# Patient Record
Sex: Male | Born: 1945
Health system: Southern US, Community
[De-identification: ages and names within clinical notes are randomized; demographics above are authoritative.]

## PROBLEM LIST (undated history)

## (undated) DIAGNOSIS — F32A Depression, unspecified: Secondary | ICD-10-CM

## (undated) DIAGNOSIS — F329 Major depressive disorder, single episode, unspecified: Secondary | ICD-10-CM

## (undated) DIAGNOSIS — C61 Malignant neoplasm of prostate: Secondary | ICD-10-CM

## (undated) DIAGNOSIS — C801 Malignant (primary) neoplasm, unspecified: Secondary | ICD-10-CM

## (undated) DIAGNOSIS — E119 Type 2 diabetes mellitus without complications: Secondary | ICD-10-CM

## (undated) DIAGNOSIS — H409 Unspecified glaucoma: Secondary | ICD-10-CM

---

## 2015-11-16 ENCOUNTER — Encounter (HOSPITAL_BASED_OUTPATIENT_CLINIC_OR_DEPARTMENT_OTHER): Payer: Self-pay | Admitting: *Deleted

## 2015-11-16 ENCOUNTER — Emergency Department (HOSPITAL_BASED_OUTPATIENT_CLINIC_OR_DEPARTMENT_OTHER)
Admission: EM | Admit: 2015-11-16 | Discharge: 2015-11-16 | Disposition: A | Discharge status: Home / Self Care | Payer: Medicare Other | Attending: Emergency Medicine | Admitting: Emergency Medicine

## 2015-11-16 ENCOUNTER — Emergency Department (HOSPITAL_BASED_OUTPATIENT_CLINIC_OR_DEPARTMENT_OTHER): Payer: Medicare Other

## 2015-11-16 DIAGNOSIS — Z7982 Long term (current) use of aspirin: Secondary | ICD-10-CM | POA: Diagnosis not present

## 2015-11-16 DIAGNOSIS — R358 Other polyuria: Secondary | ICD-10-CM | POA: Insufficient documentation

## 2015-11-16 DIAGNOSIS — Z79899 Other long term (current) drug therapy: Secondary | ICD-10-CM | POA: Diagnosis not present

## 2015-11-16 DIAGNOSIS — R631 Polydipsia: Secondary | ICD-10-CM | POA: Diagnosis not present

## 2015-11-16 DIAGNOSIS — R51 Headache: Secondary | ICD-10-CM | POA: Diagnosis present

## 2015-11-16 DIAGNOSIS — R739 Hyperglycemia, unspecified: Secondary | ICD-10-CM | POA: Diagnosis not present

## 2015-11-16 DIAGNOSIS — H538 Other visual disturbances: Secondary | ICD-10-CM | POA: Diagnosis not present

## 2015-11-16 HISTORY — DX: Depression, unspecified: F32.A

## 2015-11-16 HISTORY — DX: Major depressive disorder, single episode, unspecified: F32.9

## 2015-11-16 HISTORY — DX: Unspecified glaucoma: H40.9

## 2015-11-16 LAB — COMPREHENSIVE METABOLIC PANEL
ALBUMIN: 3.9 g/dL (ref 3.5–5.0)
ALK PHOS: 104 U/L (ref 38–126)
ALT: 33 U/L (ref 17–63)
AST: 30 U/L (ref 15–41)
Anion gap: 10 (ref 5–15)
BUN: 11 mg/dL (ref 6–20)
CALCIUM: 9.1 mg/dL (ref 8.9–10.3)
CHLORIDE: 98 mmol/L — AB (ref 101–111)
CO2: 24 mmol/L (ref 22–32)
Creatinine, Ser: 0.9 mg/dL (ref 0.61–1.24)
GFR calc non Af Amer: >60 mL/min (ref >60)
GLUCOSE: 435 mg/dL — AB (ref 65–99)
Potassium: 4.4 mmol/L (ref 3.5–5.1)
SODIUM: 132 mmol/L — AB (ref 135–145)
Total Bilirubin: 1.1 mg/dL (ref 0.3–1.2)
Total Protein: 8 g/dL (ref 6.5–8.1)

## 2015-11-16 LAB — CBG MONITORING, ED
GLUCOSE-CAPILLARY: 338 mg/dL — AB (ref 65–99)
Glucose-Capillary: 376 mg/dL — ABNORMAL HIGH (ref 65–99)

## 2015-11-16 MED ORDER — METFORMIN HCL 500 MG PO TABS
500.0000 mg | ORAL_TABLET | Freq: Once | ORAL | Status: AC
  Administered 2015-11-16: 500 mg via ORAL
  Filled 2015-11-16: qty 1

## 2015-11-16 MED ORDER — METFORMIN HCL 500 MG PO TABS: 500.0000 mg | ORAL_TABLET | Freq: Two times a day (BID) | ORAL | Status: AC

## 2015-11-16 MED ORDER — ACETAMINOPHEN 325 MG PO TABS
650.0000 mg | ORAL_TABLET | Freq: Once | ORAL | Status: AC
  Administered 2015-11-16: 650 mg via ORAL
  Filled 2015-11-16: qty 2

## 2015-11-16 MED ORDER — SODIUM CHLORIDE 0.9 % IV BOLUS (SEPSIS)
2000.0000 mL | Freq: Once | INTRAVENOUS | Status: AC
  Administered 2015-11-16: 2000 mL via INTRAVENOUS

## 2015-11-16 NOTE — ED Notes (Signed)
Nathan Christian at [facility] called and given report about the patient and the patients needs. Patient is very receptive to learning about his new onset Diabetes.

## 2015-11-16 NOTE — Discharge Instructions (Signed)
Hyperglycemia Keep your scheduled appointment at the [facility] clinic in 2 days. Today's blood sugar was elevated at 435. Hyperglycemia occurs when the glucose (sugar) in your blood is too high. Hyperglycemia can happen for many reasons, but it most often happens to people who do not know they have diabetes or are not managing their diabetes properly.  CAUSES  Whether you have diabetes or not, there are other causes of hyperglycemia. Hyperglycemia can occur when you have diabetes, but it can also occur in other situations that you might not be as aware of, such as: Diabetes  If you have diabetes and are having problems controlling your blood glucose, hyperglycemia could occur because of some of the following reasons:  Not following your meal plan.  Not taking your diabetes medications or not taking it properly.  Exercising less or doing less activity than you normally do.  Being sick. Pre-diabetes  This cannot be ignored. Before people develop Type 2 diabetes, they almost always have "pre-diabetes." This is when your blood glucose levels are higher than normal, but not yet high enough to be diagnosed as diabetes. Research has shown that some long-term damage to the body, especially the heart and circulatory system, may already be occurring during pre-diabetes. If you take action to manage your blood glucose when you have pre-diabetes, you may delay or prevent Type 2 diabetes from developing. Stress  If you have diabetes, you may be "diet" controlled or on oral medications or insulin to control your diabetes. However, you may find that your blood glucose is higher than usual in the hospital whether you have diabetes or not. This is often referred to as "stress hyperglycemia." Stress can elevate your blood glucose. This happens because of hormones put out by the body during times of stress. If stress has been the cause of your high blood glucose, it can be followed regularly by your caregiver. That way  he/she can make sure your hyperglycemia does not continue to get worse or progress to diabetes. Steroids  Steroids are medications that act on the infection fighting system (immune system) to block inflammation or infection. One side effect can be a rise in blood glucose. Most people can produce enough extra insulin to allow for this rise, but for those who cannot, steroids make blood glucose levels go even higher. It is not unusual for steroid treatments to "uncover" diabetes that is developing. It is not always possible to determine if the hyperglycemia will go away after the steroids are stopped. A special blood test called an A1c is sometimes done to determine if your blood glucose was elevated before the steroids were started. SYMPTOMS  Thirsty.  Frequent urination.  Dry mouth.  Blurred vision.  Tired or fatigue.  Weakness.  Sleepy.  Tingling in feet or leg. DIAGNOSIS  Diagnosis is made by monitoring blood glucose in one or all of the following ways:  A1c test. This is a chemical found in your blood.  Fingerstick blood glucose monitoring.  Laboratory results. TREATMENT  First, knowing the cause of the hyperglycemia is important before the hyperglycemia can be treated. Treatment may include, but is not be limited to:  Education.  Change or adjustment in medications.  Change or adjustment in meal plan.  Treatment for an illness, infection, etc.  More frequent blood glucose monitoring.  Change in exercise plan.  Decreasing or stopping steroids.  Lifestyle changes. HOME CARE INSTRUCTIONS   Test your blood glucose as directed.  Exercise regularly. Your caregiver will give you  instructions about exercise. Pre-diabetes or diabetes which comes on with stress is helped by exercising.  Eat wholesome, balanced meals. Eat often and at regular, fixed times. Your caregiver or nutritionist will give you a meal plan to guide your sugar intake.  Being at an ideal weight is  important. If needed, losing as little as 10 to 15 pounds may help improve blood glucose levels. SEEK MEDICAL CARE IF:   You have questions about medicine, activity, or diet.  You continue to have symptoms (problems such as increased thirst, urination, or weight gain). SEEK IMMEDIATE MEDICAL CARE IF:   You are vomiting or have diarrhea.  Your breath smells fruity.  You are breathing faster or slower.  You are very sleepy or incoherent.  You have numbness, tingling, or pain in your feet or hands.  You have chest pain.  Your symptoms get worse even though you have been following your caregiver's orders.  If you have any other questions or concerns.   This information is not intended to replace advice given to you by your health care provider. Make sure you discuss any questions you have with your health care provider.   Document Released: 02/17/2001 Document Revised: 11/16/2011 Document Reviewed: 04/30/2015 Elsevier Interactive Patient Education [employer].

## 2015-11-16 NOTE — ED Notes (Signed)
Pt from [facility].  Reports that he has had an intermittent HA behind his eyes and in his forehead x 3 days.  Denies fever.

## 2015-11-16 NOTE — ED Provider Notes (Signed)
CSN: [id number]     Arrival date & time 11/16/15  [id number] History   First MD Initiated Contact with Patient 11/16/15 1008     Chief Complaint  Patient presents with  . Headache     (Consider location/radiation/quality/duration/timing/severity/associated sxs/prior Treatment) HPI Complains a frontal headache and seeing scotomata i.e. "dots "intermittently for the past 5 days. Had a gradual onset. No focal numbness or weakness. No nausea or vomiting. Pain is mild at present. Treated with ibuprofen without relief. No fever. No trauma. No other associated symptoms. He does not see scotomata presently. No other associated symptoms No past medical history on file. Hepatitis C, glaucoma No past surgical history on file. No family history on file. Social History  Substance Use Topics  . Smoking status: Not on file  . Smokeless tobacco: Not on file  . Alcohol Use: Not on file    former smoker. Former user of alcohol. Former user of cocaine. None in the 120 days Review of Systems  Constitutional: Negative.   Eyes: Positive for visual disturbance. Negative for pain and discharge.  Respiratory: Negative.   Cardiovascular: Negative.   Gastrointestinal: Negative.   Endocrine: Positive for polydipsia and polyuria.  Musculoskeletal: Negative.   Skin: Negative.   Neurological: Positive for headaches.  Psychiatric/Behavioral: Negative.   All other systems reviewed and are negative.     Allergies  Review of patient's allergies indicates not on file.  Home Medications   Prior to Admission medications   Medication Sig Start Date End Date Taking? Authorizing Provider  aspirin EC 81 MG tablet Take 81 mg by mouth daily.   Yes Historical Provider, MD  ketotifen (ZADITOR) 0.025 % ophthalmic solution 1 drop 2 (two) times daily.   Yes Historical Provider, MD  Ledipasvir-Sofosbuvir 90-400 MG TABS Take by mouth.   Yes Historical Provider, MD  omeprazole (PRILOSEC) 20 MG capsule Take 20 mg by mouth  daily.   Yes Historical Provider, MD  sertraline (ZOLOFT) 100 MG tablet Take 100 mg by mouth daily.   Yes Historical Provider, MD   BP 120/84 mmHg  Pulse 72  Temp(Src) 98.8 F (37.1 C) (Oral)  Resp 18  Ht 6\' 1"  (1.854 m)  Wt 168 lb 7 oz (76.403 kg)  BMI 22.23 kg/m2  SpO2 98% Physical Exam  Constitutional: He is oriented to person, place, and time. He appears well-developed and well-nourished. No distress.  HENT:  Head: Normocephalic and atraumatic.  Eyes: Conjunctivae are normal. Pupils are equal, round, and reactive to light.  Neck: Neck supple. No tracheal deviation present. No thyromegaly present.  Cardiovascular: Normal rate and regular rhythm.   No murmur heard. Pulmonary/Chest: Effort normal and breath sounds normal.  Abdominal: Soft. Bowel sounds are normal. He exhibits no distension. There is no tenderness.  Musculoskeletal: Normal range of motion. He exhibits no edema or tenderness.  Neurological: He is alert and oriented to person, place, and time. No cranial nerve deficit. Coordination normal.  Gait normal Romberg normal. Pronator Drift normal DTR symmetric bilaterally knee jerk and ankle jerk and biceps was normal bilaterally finger to nose normal  Skin: Skin is warm and dry. No rash noted.  Psychiatric: He has a normal mood and affect.  Nursing note and vitals reviewed.   ED Course  Procedures (including critical care time) Labs Review Labs Reviewed - No data to display  Imaging Review No results found. I have personally reviewed and evaluated these images and lab results as part of my medical decision-making.   EKG Interpretation  None     2:40 PM after treatment with intravenous fluids, oral metformin and Tylenol patient is asymptomatic, pain-free, vision is normal. Appears in no distress Results for orders placed or performed during the hospital encounter of 11/16/15  Comprehensive metabolic panel  Result Value Ref Range   Sodium 132 (L) 135 - 145 mmol/L    Potassium 4.4 3.5 - 5.1 mmol/L   Chloride 98 (L) 101 - 111 mmol/L   CO2 24 22 - 32 mmol/L   Glucose, Bld 435 (H) 65 - 99 mg/dL   BUN 11 6 - 20 mg/dL   Creatinine, Ser 0.90 0.61 - 1.24 mg/dL   Calcium 9.1 8.9 - 10.3 mg/dL   Total Protein 8.0 6.5 - 8.1 g/dL   Albumin 3.9 3.5 - 5.0 g/dL   AST 30 15 - 41 U/L   ALT 33 17 - 63 U/L   Alkaline Phosphatase 104 38 - 126 U/L   Total Bilirubin 1.1 0.3 - 1.2 mg/dL   GFR calc non Af Amer >60 >60 mL/min   GFR calc Af Amer >60 >60 mL/min   Anion gap 10 5 - 15  CBG monitoring, ED  Result Value Ref Range   Glucose-Capillary 376 (H) 65 - 99 mg/dL   Ct Head Wo Contrast  11/16/2015  CLINICAL DATA:  Frontal headache x5 days, visual disturbance EXAM: CT HEAD WITHOUT CONTRAST TECHNIQUE: Contiguous axial images were obtained from the base of the skull through the vertex without intravenous contrast. COMPARISON:  None. FINDINGS: Motion degraded images. No evidence of parenchymal hemorrhage or extra-axial fluid collection. No mass lesion, mass effect, or midline shift. No CT evidence of acute infarction. Mild subcortical white matter and periventricular small vessel ischemic changes. Intracranial atherosclerosis. Cerebral volume is within normal limits.  No ventriculomegaly. The visualized paranasal sinuses are essentially clear. The mastoid air cells are unopacified. No evidence of calvarial fracture. IMPRESSION: Motion degraded images. No evidence of acute intracranial abnormality. Mild small vessel ischemic changes. Electronically Signed   By: Julian Hy M.D.   On: 11/16/2015 11:15    MDM  Plan prescription metformin. He is to keep his scheduled appointment with his doctor at the [facility] clinic in 2 days to discuss treatment options for diabetes dx. #1 nonspecific headache #2 hyperglycemia Final diagnoses:  None        Orlie Dakin, MD 11/16/15 1444

## 2017-05-14 ENCOUNTER — Emergency Department (HOSPITAL_BASED_OUTPATIENT_CLINIC_OR_DEPARTMENT_OTHER): Payer: Medicare Other

## 2017-05-14 ENCOUNTER — Emergency Department (HOSPITAL_BASED_OUTPATIENT_CLINIC_OR_DEPARTMENT_OTHER)
Admission: EM | Admit: 2017-05-14 | Discharge: 2017-05-14 | Discharge status: Against Medical Advice | Payer: Medicare Other | Attending: Emergency Medicine | Admitting: Emergency Medicine

## 2017-05-14 ENCOUNTER — Encounter (HOSPITAL_BASED_OUTPATIENT_CLINIC_OR_DEPARTMENT_OTHER): Payer: Self-pay | Admitting: *Deleted

## 2017-05-14 DIAGNOSIS — L03113 Cellulitis of right upper limb: Secondary | ICD-10-CM | POA: Diagnosis not present

## 2017-05-14 DIAGNOSIS — E119 Type 2 diabetes mellitus without complications: Secondary | ICD-10-CM | POA: Diagnosis not present

## 2017-05-14 DIAGNOSIS — Z7982 Long term (current) use of aspirin: Secondary | ICD-10-CM | POA: Insufficient documentation

## 2017-05-14 DIAGNOSIS — E876 Hypokalemia: Secondary | ICD-10-CM

## 2017-05-14 DIAGNOSIS — Z8546 Personal history of malignant neoplasm of prostate: Secondary | ICD-10-CM | POA: Insufficient documentation

## 2017-05-14 DIAGNOSIS — E871 Hypo-osmolality and hyponatremia: Secondary | ICD-10-CM | POA: Diagnosis not present

## 2017-05-14 DIAGNOSIS — Z79899 Other long term (current) drug therapy: Secondary | ICD-10-CM | POA: Diagnosis not present

## 2017-05-14 DIAGNOSIS — Z7984 Long term (current) use of oral hypoglycemic drugs: Secondary | ICD-10-CM | POA: Diagnosis not present

## 2017-05-14 DIAGNOSIS — R2231 Localized swelling, mass and lump, right upper limb: Secondary | ICD-10-CM | POA: Diagnosis present

## 2017-05-14 HISTORY — DX: Malignant (primary) neoplasm, unspecified: C80.1

## 2017-05-14 HISTORY — DX: Type 2 diabetes mellitus without complications: E11.9

## 2017-05-14 HISTORY — DX: Malignant neoplasm of prostate: C61

## 2017-05-14 LAB — CBC WITH DIFFERENTIAL/PLATELET
BAND NEUTROPHILS: 1 %
BASOS PCT: 0 %
Basophils Absolute: 0 10*3/uL (ref 0.0–0.1)
EOS PCT: 0 %
Eosinophils Absolute: 0 10*3/uL (ref 0.0–0.7)
HEMATOCRIT: 36.4 % — AB (ref 39.0–52.0)
Hemoglobin: 12.3 g/dL — ABNORMAL LOW (ref 13.0–17.0)
Lymphocytes Relative: 9 %
Lymphs Abs: 1.8 10*3/uL (ref 0.7–4.0)
MCH: 26.7 pg (ref 26.0–34.0)
MCHC: 33.8 g/dL (ref 30.0–36.0)
MCV: 79.1 fL (ref 78.0–100.0)
Monocytes Absolute: 2.6 10*3/uL — ABNORMAL HIGH (ref 0.1–1.0)
Monocytes Relative: 13 %
NEUTROS ABS: 15.3 10*3/uL — AB (ref 1.7–7.7)
Neutrophils Relative %: 77 %
Platelets: 261 10*3/uL (ref 150–400)
RBC: 4.6 MIL/uL (ref 4.22–5.81)
RDW: 15.6 % — AB (ref 11.5–15.5)
WBC: 19.7 10*3/uL — ABNORMAL HIGH (ref 4.0–10.5)

## 2017-05-14 LAB — COMPREHENSIVE METABOLIC PANEL
ALBUMIN: 3.1 g/dL — AB (ref 3.5–5.0)
ALT: 27 U/L (ref 17–63)
AST: 48 U/L — AB (ref 15–41)
Alkaline Phosphatase: 65 U/L (ref 38–126)
Anion gap: 11 (ref 5–15)
BUN: 10 mg/dL (ref 6–20)
CHLORIDE: 90 mmol/L — AB (ref 101–111)
CO2: 26 mmol/L (ref 22–32)
CREATININE: 0.98 mg/dL (ref 0.61–1.24)
Calcium: 8.4 mg/dL — ABNORMAL LOW (ref 8.9–10.3)
GFR calc non Af Amer: >60 mL/min (ref >60)
GLUCOSE: 110 mg/dL — AB (ref 65–99)
Potassium: 3 mmol/L — ABNORMAL LOW (ref 3.5–5.1)
SODIUM: 127 mmol/L — AB (ref 135–145)
Total Bilirubin: 1.2 mg/dL (ref 0.3–1.2)
Total Protein: 7.2 g/dL (ref 6.5–8.1)

## 2017-05-14 LAB — I-STAT CG4 LACTIC ACID, ED: LACTIC ACID, VENOUS: 1.89 mmol/L (ref 0.5–1.9)

## 2017-05-14 LAB — RAPID URINE DRUG SCREEN, HOSP PERFORMED
AMPHETAMINES: NOT DETECTED
BARBITURATES: NOT DETECTED
BENZODIAZEPINES: NOT DETECTED
COCAINE: POSITIVE — AB
Opiates: POSITIVE — AB
Tetrahydrocannabinol: NOT DETECTED

## 2017-05-14 LAB — URINALYSIS, ROUTINE W REFLEX MICROSCOPIC
Bilirubin Urine: NEGATIVE
GLUCOSE, UA: NEGATIVE mg/dL
KETONES UR: 15 mg/dL — AB
Nitrite: NEGATIVE
PH: 6.5 (ref 5.0–8.0)
PROTEIN: NEGATIVE mg/dL
Specific Gravity, Urine: 1.01 (ref 1.005–1.030)

## 2017-05-14 LAB — ETHANOL

## 2017-05-14 LAB — URINALYSIS, MICROSCOPIC (REFLEX): RBC / HPF: NONE SEEN RBC/hpf (ref 0–5)

## 2017-05-14 LAB — CK: Total CK: 501 U/L — ABNORMAL HIGH (ref 49–397)

## 2017-05-14 MED ORDER — SODIUM CHLORIDE 0.9 % IV BOLUS (SEPSIS)
1000.0000 mL | Freq: Once | INTRAVENOUS | Status: AC
  Administered 2017-05-14: 1000 mL via INTRAVENOUS

## 2017-05-14 MED ORDER — SODIUM CHLORIDE 0.9 % IV BOLUS (SEPSIS)
500.0000 mL | Freq: Once | INTRAVENOUS | Status: AC
  Administered 2017-05-14: 500 mL via INTRAVENOUS

## 2017-05-14 MED ORDER — VANCOMYCIN HCL IN DEXTROSE 1-5 GM/200ML-% IV SOLN
1000.0000 mg | Freq: Once | INTRAVENOUS | Status: AC
  Administered 2017-05-14: 1000 mg via INTRAVENOUS
  Filled 2017-05-14: qty 200

## 2017-05-14 MED ORDER — PIPERACILLIN-TAZOBACTAM 3.375 G IVPB: 3.3750 g | Freq: Three times a day (TID) | INTRAVENOUS | Status: DC

## 2017-05-14 MED ORDER — PIPERACILLIN-TAZOBACTAM 3.375 G IVPB 30 MIN
3.3750 g | Freq: Once | INTRAVENOUS | Status: AC
  Administered 2017-05-14: 3.375 g via INTRAVENOUS
  Filled 2017-05-14 (×2): qty 50

## 2017-05-14 MED ORDER — MORPHINE SULFATE (PF) 4 MG/ML IV SOLN
4.0000 mg | Freq: Once | INTRAVENOUS | Status: AC
  Administered 2017-05-14: 4 mg via INTRAVENOUS
  Filled 2017-05-14: qty 1

## 2017-05-14 MED ORDER — POTASSIUM CHLORIDE CRYS ER 20 MEQ PO TBCR
40.0000 meq | EXTENDED_RELEASE_TABLET | Freq: Once | ORAL | Status: AC
  Administered 2017-05-14: 40 meq via ORAL
  Filled 2017-05-14: qty 2

## 2017-05-14 MED ORDER — VANCOMYCIN HCL IN DEXTROSE 1-5 GM/200ML-% IV SOLN: 1000.0000 mg | Freq: Two times a day (BID) | INTRAVENOUS | Status: DC

## 2017-05-14 MED ORDER — CLINDAMYCIN HCL 300 MG PO CAPS: 300.0000 mg | ORAL_CAPSULE | Freq: Four times a day (QID) | ORAL | 0 refills | Status: AC

## 2017-05-14 MED FILL — CLINDAMYCIN HCL 300 MG CAPS: 300 | 10 days supply | Qty: 40 | Fill #0

## 2017-05-14 NOTE — Discharge Instructions (Signed)
You signed out against medical advice.   Take clindamycin as prescribed for arm cellulitis. Your infection is bad enough that you should stay in the hospital for IV antibiotics.   Take tylenol, motrin for pain.   Avoid using IV drugs.   See [facility] for follow up as soon as possible or see your doctor.   Return to ER if you have worse arm redness and swelling, fevers, fingers turning blue or arm numbness.

## 2017-05-14 NOTE — ED Triage Notes (Signed)
Swelling, pain and redness of his right arm. States he has been injecting Heroin.

## 2017-05-14 NOTE — Progress Notes (Addendum)
Pharmacy Antibiotic Note  Nathan Christian is a 71 y.o. male admitted on 05/14/2017 with cellulitis.  Pharmacy has been consulted for vancomycin and Zosyn dosing. Per RN notes, patient admitted to injecting heroin.  Plan: Vancomycin 1000mg  IV x1 Zosyn 3.375g IV x1 Will follow labs and enter maintenance doses based on renal function  Height: 6\' 1"  (185.4 cm) Weight: 169 lb (76.7 kg) IBW/kg (Calculated) : 79.9  Temp (24hrs), Avg:100 F (37.8 C), Min:100 F (37.8 C), Max:100 F (37.8 C)  No results for input(s): WBC, CREATININE, LATICACIDVEN, VANCOTROUGH, VANCOPEAK, VANCORANDOM, GENTTROUGH, GENTPEAK, GENTRANDOM, TOBRATROUGH, TOBRAPEAK, TOBRARND, AMIKACINPEAK, AMIKACINTROU, AMIKACIN in the last 168 hours.  CrCl cannot be calculated (Patient's most recent lab result is older than the maximum 21 days allowed.).    No Known Allergies  Antimicrobials this admission: Vancomycin 9/7 >>  Zosyn 9/7 >>   Dose adjustments this admission: n/a  Microbiology results: 9/7 BCx:     Thank you for allowing pharmacy to be a part of this patient's care.  Nathan Christian D. Annalicia Renfrew, PharmD, BCPS Clinical Pharmacist Pager: [phone] Clinical Phone for 05/14/2017 until 3:30pm: [phone] If after 3:30pm, please call main pharmacy at [phone] 05/14/2017 1:38 PM

## 2017-05-14 NOTE — Progress Notes (Signed)
Pharmacy Antibiotic Note Harsha Yusko is a 71 y.o. male admitted on 05/14/2017 with cellulitis.  Pharmacy has been consulted for vancomycin and Zosyn dosing. Per RN notes, patient admitted to injecting heroin.  Plan: 1. Vancomycin 1000mg  IV every 12 hours 2. Obtain vancomycin trough at [facility]; goal trough 15-20 until bacteremia can be r/o 3. Zosyn 3.375g IV every 8 hours (infused over 4 hours)   Height: 6\' 1"  (185.4 cm) Weight: 169 lb (76.7 kg) IBW/kg (Calculated) : 79.9  Temp (24hrs), Avg:100 F (37.8 C), Min:100 F (37.8 C), Max:100 F (37.8 C)   Recent Labs Lab 05/14/17 1347 05/14/17 1357  WBC 19.7*  --   CREATININE 0.98  --   LATICACIDVEN  --  1.89    Estimated Creatinine Clearance: 77.2 mL/min (by C-G formula based on SCr of 0.98 mg/dL).    No Known Allergies  Antimicrobials this admission: Vancomycin 9/7 >>  Zosyn 9/7 >>   Microbiology results: 9/7 BCx:    Thank you for allowing pharmacy to be a part of this patient's care.  Vincenza Hews, PharmD, BCPS 05/14/2017, 3:20 PM

## 2017-05-14 NOTE — ED Notes (Signed)
Patient transported to X-ray 

## 2017-05-14 NOTE — ED Provider Notes (Signed)
[facility] Provider Note   CSN: [id number] Arrival date & time: 05/14/17  1254     History   Chief Complaint Chief Complaint  Patient presents with  . Cellulitis    HPI Nathan Christian is a 71 y.o. male history of diabetes, known heroin abuser, cocaine user here presenting with right arm swelling and redness. Patient states that he does inject heroin very frequently last injected the back of his right forearm about 3 days ago. He states that over the last several days that forearm has become progressively swollen and tender and painful. He has some chills but denies any fevers. He states that he just wanted some antibiotics and does not want to stay in the hospital right now.  The history is provided by the patient.    Past Medical History:  Diagnosis Date  . Cancer ([place])   . Depression   . Diabetes mellitus without complication ([place])   . Glaucoma   . Prostate cancer ([place])     There are no active problems to display for this patient.   History reviewed. No pertinent surgical history.     Home Medications    Prior to Admission medications   Medication Sig Start Date End Date Taking? Authorizing Provider  aspirin EC 81 MG tablet Take 81 mg by mouth daily.    [provider]  ketotifen (ZADITOR) 0.025 % ophthalmic solution 1 drop 2 (two) times daily.    [provider]  Ledipasvir-Sofosbuvir 90-400 MG TABS Take by mouth.    [provider]  metFORMIN (GLUCOPHAGE) 500 MG tablet Take 1 tablet (500 mg total) by mouth 2 (two) times daily with a meal. 11/16/15   Winfred Leeds, Sam, MD  omeprazole (PRILOSEC) 20 MG capsule Take 20 mg by mouth daily.    [provider]  sertraline (ZOLOFT) 100 MG tablet Take 100 mg by mouth daily.    [provider]    Family History No family history on file.  Social History Social History  Substance Use Topics  . Smoking status: Never Smoker  . Smokeless tobacco: Never Used  .  Alcohol use No     Allergies   Patient has no known allergies.   Review of Systems Review of Systems  Skin: Positive for color change.  All other systems reviewed and are negative.    Physical Exam Updated Vital Signs BP 138/68   Pulse 90   Temp 100 F (37.8 C) (Oral)   Resp 17   Ht 6\' 1"  (1.854 m)   Wt 76.7 kg (169 lb)   SpO2 100%   BMI 22.30 kg/m   Physical Exam  Constitutional:  Uncomfortable   HENT:  Head: Normocephalic.  Eyes: Pupils are equal, round, and reactive to light. Conjunctivae and EOM are normal.  Neck: Normal range of motion. Neck supple.  Cardiovascular: Normal rate, regular rhythm and normal heart sounds.   No obvious heart murmurs   Pulmonary/Chest: Effort normal and breath sounds normal. No respiratory distress. He has no wheezes.  Abdominal: Soft. Bowel sounds are normal. He exhibits no distension. There is no tenderness.  Musculoskeletal:  Multiple IV drug injection sites. R forearm with obvious cellulitis of the entire forearm, R radial pulse palpable. No obvious subcutaneous air. No obvious fluctuance. Multiple injection sites on the forearm as well.   Neurological: He is alert.  Skin: Skin is warm. There is erythema.  Psychiatric: He has a normal mood and affect.  Nursing note and vitals reviewed.  ED Treatments / Results  Labs (all labs ordered are listed, but only abnormal results are displayed) Labs Reviewed  COMPREHENSIVE METABOLIC PANEL - Abnormal; Notable for the following:       Result Value   Sodium 127 (*)    Potassium 3.0 (*)    Chloride 90 (*)    Glucose, Bld 110 (*)    Calcium 8.4 (*)    Albumin 3.1 (*)    AST 48 (*)    All other components within normal limits  CBC WITH DIFFERENTIAL/PLATELET - Abnormal; Notable for the following:    WBC 19.7 (*)    Hemoglobin 12.3 (*)    HCT 36.4 (*)    RDW 15.6 (*)    Neutro Abs 15.3 (*)    Monocytes Absolute 2.6 (*)    All other components within normal limits    URINALYSIS, ROUTINE W REFLEX MICROSCOPIC - Abnormal; Notable for the following:    Hgb urine dipstick TRACE (*)    Ketones, ur 15 (*)    Leukocytes, UA SMALL (*)    All other components within normal limits  RAPID URINE DRUG SCREEN, HOSP PERFORMED - Abnormal; Notable for the following:    Opiates POSITIVE (*)    Cocaine POSITIVE (*)    All other components within normal limits  CK - Abnormal; Notable for the following:    Total CK 501 (*)    All other components within normal limits  URINALYSIS, MICROSCOPIC (REFLEX) - Abnormal; Notable for the following:    Bacteria, UA RARE (*)    Squamous Epithelial / LPF 0-5 (*)    All other components within normal limits  CULTURE, BLOOD (ROUTINE X 2)  ETHANOL  I-STAT CG4 LACTIC ACID, ED    EKG  EKG Interpretation None       Radiology Dg Chest 2 View  Result Date: 05/14/2017 CLINICAL DATA:  Fever. EXAM: CHEST  2 VIEW COMPARISON:  Radiograph of February 05, 2013. FINDINGS: The heart size and mediastinal contours are within normal limits. Both lungs are clear. Atherosclerosis of thoracic aorta is noted. No pneumothorax or pleural effusion is noted. The visualized skeletal structures are unremarkable. IMPRESSION: No active cardiopulmonary disease.  Aortic atherosclerosis. Electronically Signed   By: Marijo Conception, M.D.   On: 05/14/2017 14:28   Dg Forearm Right  Result Date: 05/14/2017 CLINICAL DATA:  Right arm cellulitis EXAM: RIGHT FOREARM - 2 VIEW COMPARISON:  None. FINDINGS: No fracture or malalignment. No periostitis. Increased opacity proximal forearm presumably due to edema. No radiopaque foreign body. No soft tissue gas. IMPRESSION: No acute osseus abnormality. Prominent soft tissue opacity proximal forearm and elbow presumably related to the history of cellulitis. No soft tissue gas. Electronically Signed   By: Donavan Foil M.D.   On: 05/14/2017 14:29    Procedures Procedures (including critical care time)  Medications Ordered in  ED Medications  vancomycin (VANCOCIN) IVPB 1000 mg/200 mL premix (not administered)  potassium chloride SA (K-DUR,KLOR-CON) CR tablet 40 mEq (not administered)  piperacillin-tazobactam (ZOSYN) IVPB 3.375 g (not administered)  vancomycin (VANCOCIN) IVPB 1000 mg/200 mL premix (not administered)  sodium chloride 0.9 % bolus 1,000 mL (0 mLs Intravenous Stopped 05/14/17 1435)    And  sodium chloride 0.9 % bolus 1,000 mL (0 mLs Intravenous Stopped 05/14/17 1435)    And  sodium chloride 0.9 % bolus 500 mL (500 mLs Intravenous New Bag/Given 05/14/17 1357)  piperacillin-tazobactam (ZOSYN) IVPB 3.375 g (3.375 g Intravenous New Bag/Given 05/14/17 1405)  morphine 4  MG/ML injection 4 mg (4 mg Intravenous Given 05/14/17 1358)     Initial Impression / Assessment and Plan / ED Course  I have reviewed the triage vital signs and the nursing notes.  Pertinent labs & imaging results that were available during my care of the patient were reviewed by me and considered in my medical decision making (see chart for details).     Nathan Christian is a 71 y.o. male here with R forearm cellulitis. He is a known IV drug user. He has low grade temp, hypotensive 90s in triage. Code sepsis initiated. Concerned for bacteremia from cellulitis, possible nec fasc R forearm, vs simple cellulitis from IV drug use. Will give 30 cc /kg bolus, give vanc/zosyn. Will get xrays for R forearm. Patient states that he doesn't want to be admitted.   3:45 PM WBC 20. UDS + cocaine and opiates. Na 127, K 3.0. Given vanc/zosyn. BP up to 104/69. I think he is septic. I again offered admission but he refused. He is alert and not intoxicated and understands that he may lose his arm or life. He will sign AMA paperwork. Will give clindamycin, will not prescribe pain meds   Final Clinical Impressions(s) / ED Diagnoses   Final diagnoses:  None    New Prescriptions New Prescriptions   No medications on file     Drenda Freeze, MD 05/14/17  1557

## 2017-05-19 LAB — CULTURE, BLOOD (ROUTINE X 2)
CULTURE: NO GROWTH
SPECIAL REQUESTS: ADEQUATE

## 2018-01-15 IMAGING — DX DG FOREARM 2V*R*
2 series · 2 of 2 positions shown · non-contrast
Comparison: None.

CLINICAL DATA: Right arm cellulitis

EXAM:
RIGHT FOREARM - 2 VIEW

[forearm ap]
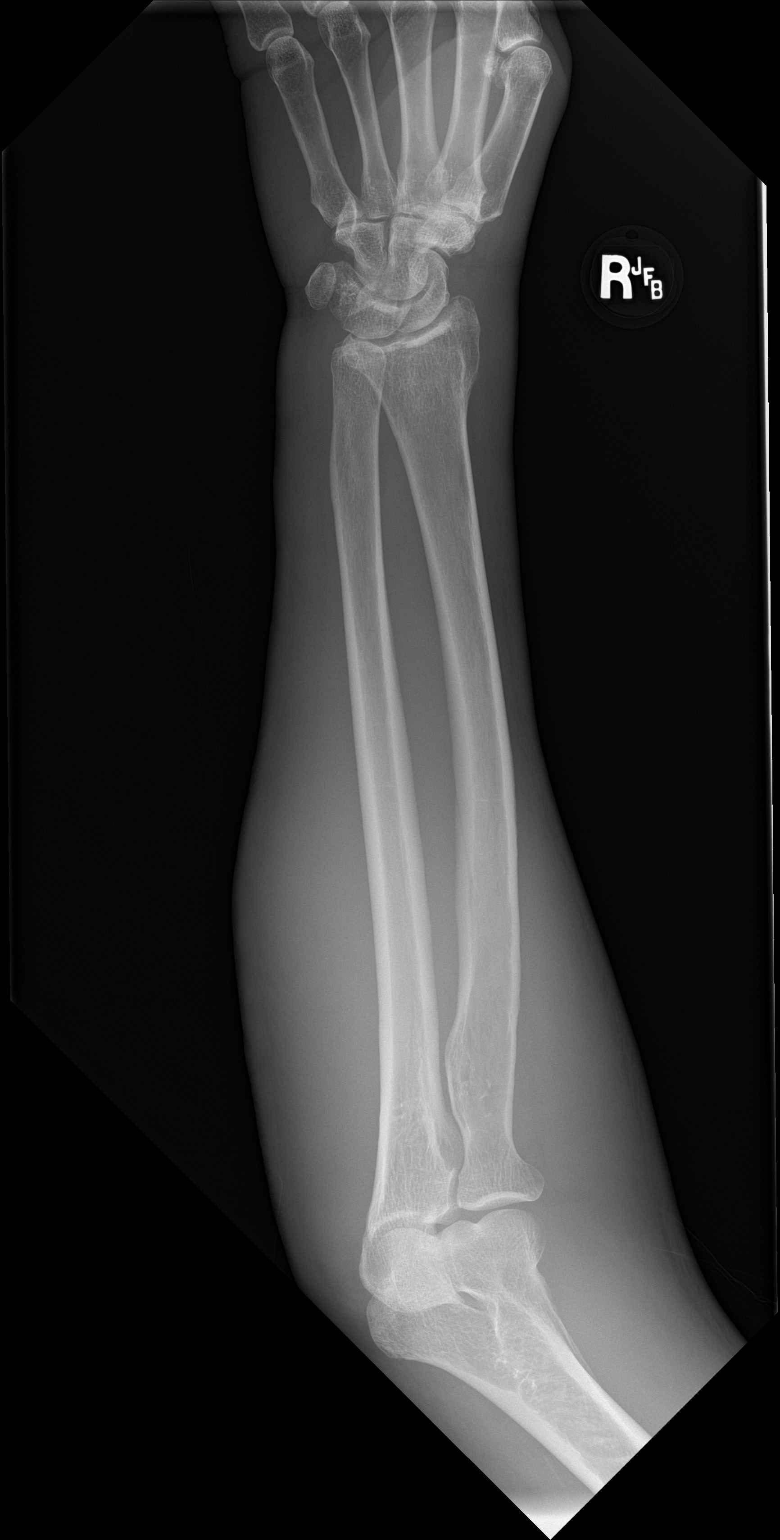

[forearm lat]
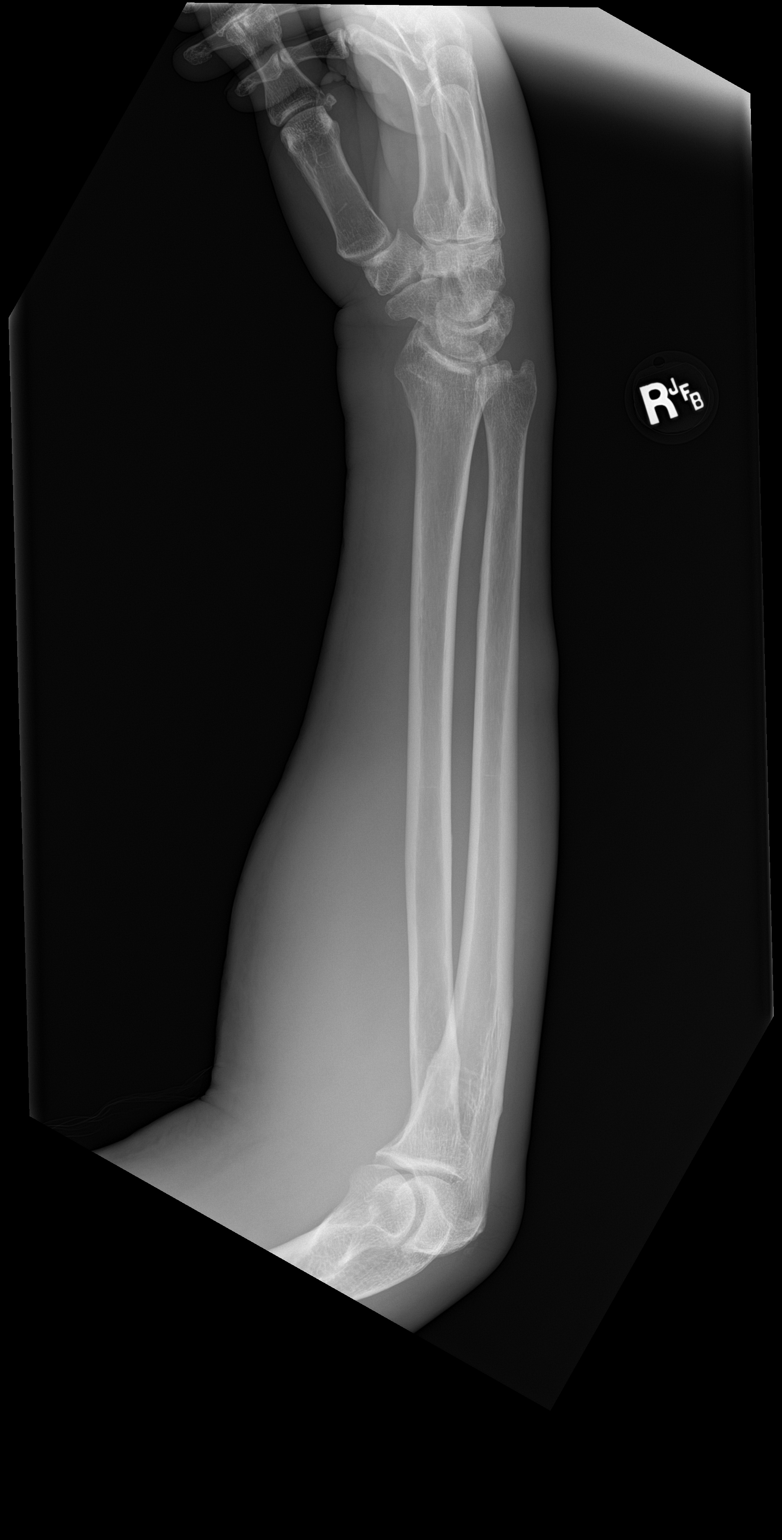

[2 of 2 positions shown; findings below may reference images not displayed]

FINDINGS: No fracture or malalignment. No periostitis. Increased opacity
proximal forearm presumably due to edema. No radiopaque foreign
body. No soft tissue gas.
IMPRESSION: No acute osseus abnormality. Prominent soft tissue opacity proximal
forearm and elbow presumably related to the history of cellulitis.
No soft tissue gas.

## 2019-09-30 ENCOUNTER — Ambulatory Visit: Payer: Medicare Other | Attending: Internal Medicine

## 2019-09-30 DIAGNOSIS — Z23 Encounter for immunization: Secondary | ICD-10-CM

## 2019-10-28 ENCOUNTER — Ambulatory Visit: Payer: Medicare Other

## 2019-11-04 ENCOUNTER — Ambulatory Visit: Payer: Medicare Other | Attending: Internal Medicine

## 2019-11-04 DIAGNOSIS — Z23 Encounter for immunization: Secondary | ICD-10-CM | POA: Insufficient documentation

## 2019-11-04 NOTE — Progress Notes (Signed)
   Covid-19 Vaccination Clinic  Name:  Nathan Christian    MRN: [id number] DOB: Jul 29, 1946  11/04/2019  [name] was observed post Covid-19 immunization for 15 minutes without incidence. He was provided with Vaccine Information Sheet and instruction to access the V-Safe system.   [name] was instructed to call 911 with any severe reactions post vaccine: Marland Kitchen Difficulty breathing  . Swelling of your face and throat  . A fast heartbeat  . A bad rash all over your body  . Dizziness and weakness    Immunizations Administered    Name Date Dose VIS Date Route   Moderna COVID-19 Vaccine 11/04/2019  9:28 AM 0.5 mL 08/08/2019 Intramuscular   Manufacturer: Moderna   Lot: [id number]   [id number]

## 2020-12-06 DEATH — deceased
